# Patient Record
Sex: Female | Born: 1995 | Hispanic: No | Marital: Single | Smoking: Never smoker
Health system: Southern US, Community
[De-identification: ages and names within clinical notes are randomized; demographics above are authoritative.]

---

## 2017-08-19 ENCOUNTER — Encounter: Payer: Self-pay | Admitting: Emergency Medicine

## 2017-08-19 ENCOUNTER — Emergency Department
Admission: EM | Admit: 2017-08-19 | Discharge: 2017-08-19 | Disposition: A | Discharge status: Home / Self Care | Attending: Emergency Medicine | Admitting: Emergency Medicine

## 2017-08-19 ENCOUNTER — Other Ambulatory Visit: Payer: Self-pay

## 2017-08-19 ENCOUNTER — Emergency Department

## 2017-08-19 DIAGNOSIS — S99912A Unspecified injury of left ankle, initial encounter: Secondary | ICD-10-CM | POA: Diagnosis present

## 2017-08-19 DIAGNOSIS — W010XXA Fall on same level from slipping, tripping and stumbling without subsequent striking against object, initial encounter: Secondary | ICD-10-CM | POA: Diagnosis not present

## 2017-08-19 DIAGNOSIS — Y998 Other external cause status: Secondary | ICD-10-CM | POA: Insufficient documentation

## 2017-08-19 DIAGNOSIS — Y9389 Activity, other specified: Secondary | ICD-10-CM | POA: Diagnosis not present

## 2017-08-19 DIAGNOSIS — Y929 Unspecified place or not applicable: Secondary | ICD-10-CM | POA: Insufficient documentation

## 2017-08-19 DIAGNOSIS — S93492A Sprain of other ligament of left ankle, initial encounter: Secondary | ICD-10-CM | POA: Insufficient documentation

## 2017-08-19 DIAGNOSIS — Z79899 Other long term (current) drug therapy: Secondary | ICD-10-CM | POA: Insufficient documentation

## 2017-08-19 DIAGNOSIS — Z91040 Latex allergy status: Secondary | ICD-10-CM | POA: Insufficient documentation

## 2017-08-19 MED ORDER — IBUPROFEN 800 MG PO TABS
800.0000 mg | ORAL_TABLET | Freq: Once | ORAL | Status: AC
  Administered 2017-08-19: 800 mg via ORAL
  Filled 2017-08-19: qty 1

## 2017-08-19 MED ORDER — IBUPROFEN 800 MG PO TABS: 800.0000 mg | ORAL_TABLET | Freq: Three times a day (TID) | ORAL | 0 refills | Status: AC | PRN

## 2017-08-19 NOTE — Discharge Instructions (Addendum)
Please rest ice and elevate the left ankle.  Use crutches as needed for ambulation.  He may begin weightbearing as tolerated without crutches once you are able to ambulate without a limp.  You may take ibuprofen 800 mg every 8 hours as needed with food for the next 7-10 days.  I would recommend purchasing lace up ankle brace, ASO brace to wear for the next 6 weeks when you are performing any significant physical activity such as running cutting or jumping to protect ankle ligaments and prevent reinjury.  You may follow-up with orthopedics in 1 week if no improvement and continued ankle pain.

## 2017-08-19 NOTE — ED Triage Notes (Signed)
States she missed a step twisted left ankle about 1 hr PTA  Positive swelling  Good pulses

## 2017-08-19 NOTE — ED Provider Notes (Signed)
[facility] EMERGENCY DEPARTMENT Provider Note   CSN: [id number] Arrival date & time: 08/19/17  1643     History   Chief Complaint Chief Complaint  Patient presents with  . Ankle Pain    HPI Alisha Abbott is a 22 y.o. female presents to the emergency department for evaluation of left ankle pain.  Patient states just earlier today she was walking suffered a mechanical fall and sprained her left ankle.  Patient suffered an inversion ankle injury.  She complains of lateral ankle pain and swelling.  She did feel a pop.  She is walking but with moderate discomfort.  She denies any medication for pain.  She denies any medial ankle pain.  No proximal tib-fib discomfort.  She denies any knee pain.  No history of ankle injuries.  HPI  History reviewed. No pertinent past medical history.  There are no active problems to display for this patient.   History reviewed. No pertinent surgical history.   OB History   None      Home Medications    Prior to Admission medications   Medication Sig Start Date End Date Taking? Authorizing Provider  escitalopram (LEXAPRO) 10 MG tablet Take 10 mg by mouth daily.   Yes [provider]  ibuprofen (ADVIL,MOTRIN) 800 MG tablet Take 1 tablet (800 mg total) by mouth every 8 (eight) hours as needed. 08/19/17   Evon Slack, PA-C    Family History No family history on file.  Social History Social History   Tobacco Use  . Smoking status: Never Smoker  . Smokeless tobacco: Never Used  Substance Use Topics  . Alcohol use: Yes    Comment: occasional  . Drug use: Not on file     Allergies   Latex   Review of Systems Review of Systems  Constitutional: Negative for fever.  Respiratory: Negative for shortness of breath.   Cardiovascular: Negative for chest pain.  Gastrointestinal: Negative for abdominal pain.  Musculoskeletal: Positive for arthralgias, gait problem and joint swelling. Negative for back pain  and myalgias.  Skin: Negative for rash.  Neurological: Negative for dizziness and headaches.     Physical Exam Updated Vital Signs BP 115/73 (BP Location: Right Arm)   Pulse 79   Temp 98.4 F (36.9 C)   Resp 20   Ht 5\' 9"  (1.753 m)   Wt 71.2 kg (157 lb)   LMP 08/08/2017 (Approximate)   SpO2 98%   BMI 23.18 kg/m   Physical Exam  Constitutional: She is oriented to person, place, and time. She appears well-developed and well-nourished.  HENT:  Head: Normocephalic and atraumatic.  Eyes: Conjunctivae are normal.  Neck: Normal range of motion.  Cardiovascular: Normal rate.  Pulmonary/Chest: Effort normal. No respiratory distress.  Musculoskeletal:  Examination of the left ankle and lower extremity shows patient has no tenderness to palpation throughout the knee or proximal tib-fib region.  Patient is tender along the lateral malleolus but nontender along the medial malleolus.  Ankle plantar flexion dorsiflexion is intact.  She is nontender throughout the calcaneus talus and tarsals or metatarsals or phalanges.  Negative syndesmotic squeeze test.  Neurological: She is alert and oriented to person, place, and time.  Skin: Skin is warm. No rash noted.  Psychiatric: She has a normal mood and affect. Her behavior is normal. Thought content normal.     ED Treatments / Results  Labs (all labs ordered are listed, but only abnormal results are displayed) Labs Reviewed - No data  to display  EKG None  Radiology Dg Ankle Complete Left  Result Date: 08/19/2017 CLINICAL DATA:  22 y/o  F; twisted ankle with swelling. EXAM: LEFT ANKLE COMPLETE - 3+ VIEW COMPARISON:  None. FINDINGS: There is no evidence of fracture, dislocation, or joint effusion. There is no evidence of arthropathy or other focal bone abnormality. Mild soft tissue of the ankle joint. IMPRESSION: No acute fracture or dislocation identified. Electronically Signed   By: Mitzi HansenLance  Furusawa-Stratton M.D.   On: 08/19/2017 17:35     Procedures .Splint Application Date/Time: 08/19/2017 6:08 PM Performed by: Evon SlackGaines, Naheem Mosco C, PA-C Authorized by: Evon SlackGaines, Joseh Sjogren C, PA-C   Consent:    Consent obtained:  Verbal   Consent given by:  Patient   Risks discussed:  Pain and swelling   Alternatives discussed:  No treatment Pre-procedure details:    Sensation:  Normal Procedure details:    Laterality:  Left   Location:  Ankle   Splint type:  Ankle stirrup   Supplies:  Prefabricated splint Post-procedure details:    Pain:  Improved   Sensation:  Normal   Patient tolerance of procedure:  Tolerated well, no immediate complications   (including critical care time)  Medications Ordered in ED Medications  ibuprofen (ADVIL,MOTRIN) tablet 800 mg (800 mg Oral Given 08/19/17 1732)     Initial Impression / Assessment and Plan / ED Course  I have reviewed the triage vital signs and the nursing notes.  Pertinent labs & imaging results that were available during my care of the patient were reviewed by me and considered in my medical decision making (see chart for details).     22 year old female with left lateral ankle sprain.  She is placed into a ankle stirrup splint.  She will start ibuprofen.  She is given crutches to help with ambulation.  She will rest ice and elevate.  She is educated on self-care for ankle sprain.  She will follow with orthopedics if no improvement 1 week.  Final Clinical Impressions(s) / ED Diagnoses   Final diagnoses:  Sprain of anterior talofibular ligament of left ankle, initial encounter    ED Discharge Orders        Ordered    ibuprofen (ADVIL,MOTRIN) 800 MG tablet  Every 8 hours PRN     08/19/17 1800       Ronnette JuniperGaines, Beyla Loney C, PA-C 08/19/17 1809    Myrna BlazerSchaevitz, David Matthew, MD 08/19/17 2042

## 2018-09-09 IMAGING — DX DG ANKLE COMPLETE 3+V*L*
3 series · 3 of 3 positions shown · non-contrast
Comparison: None.

CLINICAL DATA: 21 y/o  F; twisted ankle with swelling.

EXAM:
LEFT ANKLE COMPLETE - 3+ VIEW

[ankle ap]
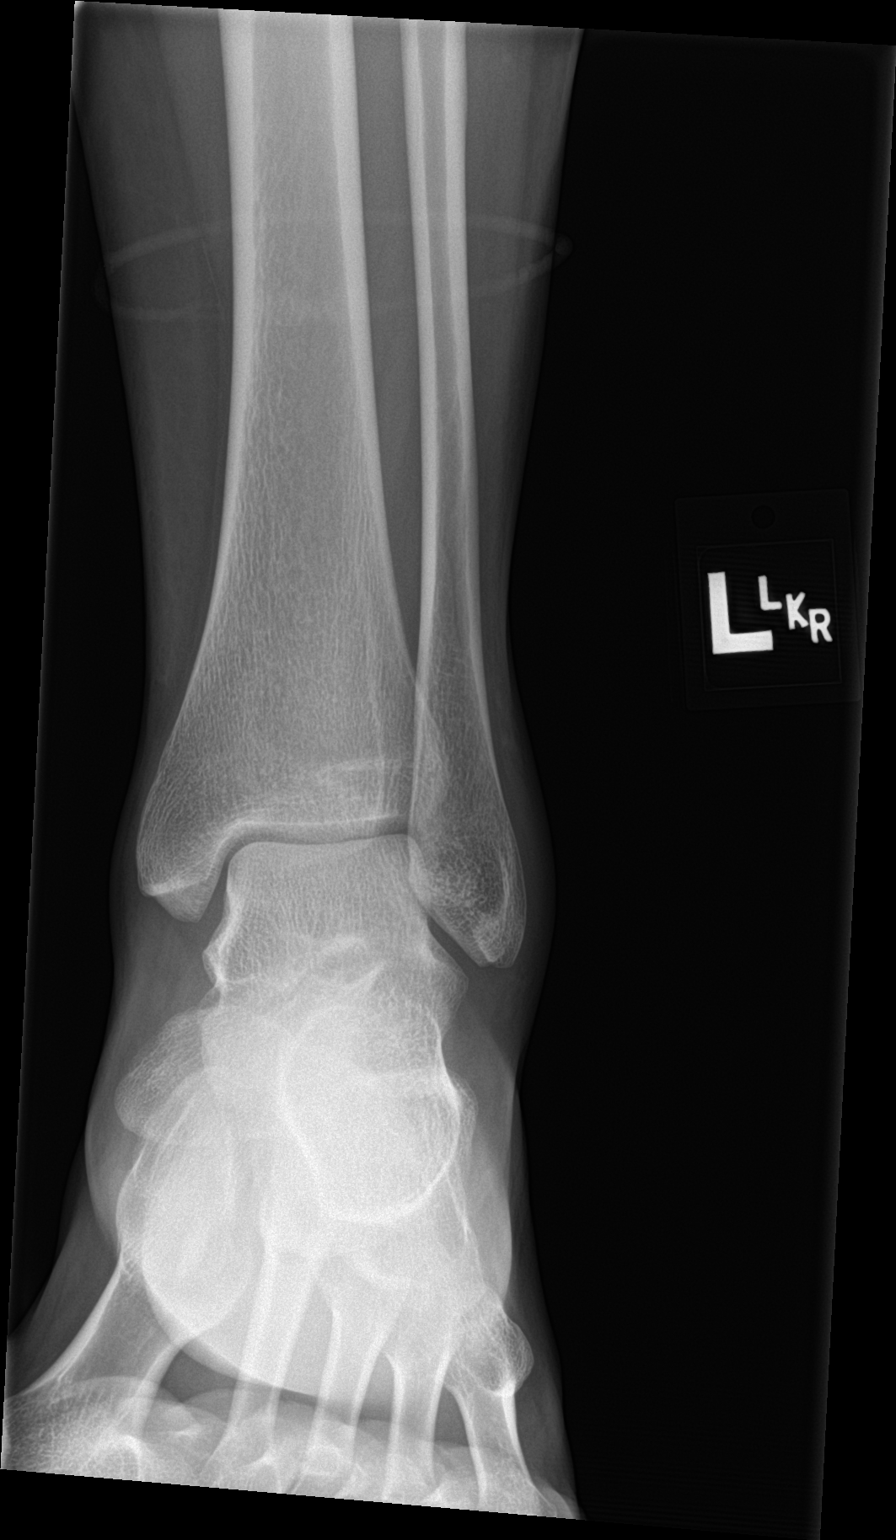

[ankle obl]
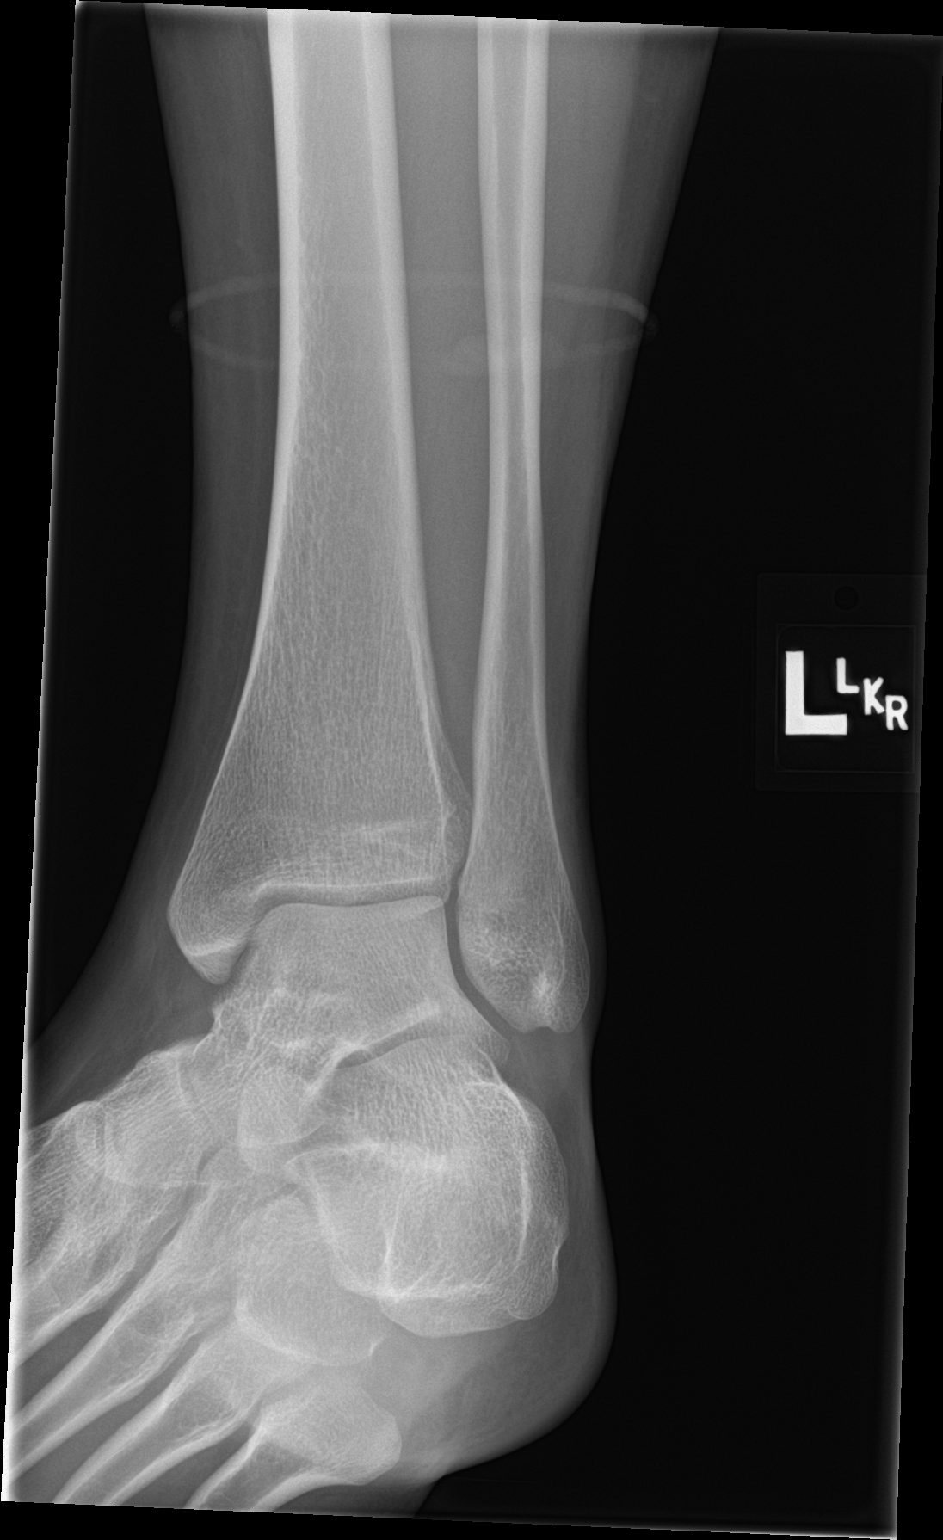

[ankle lat]
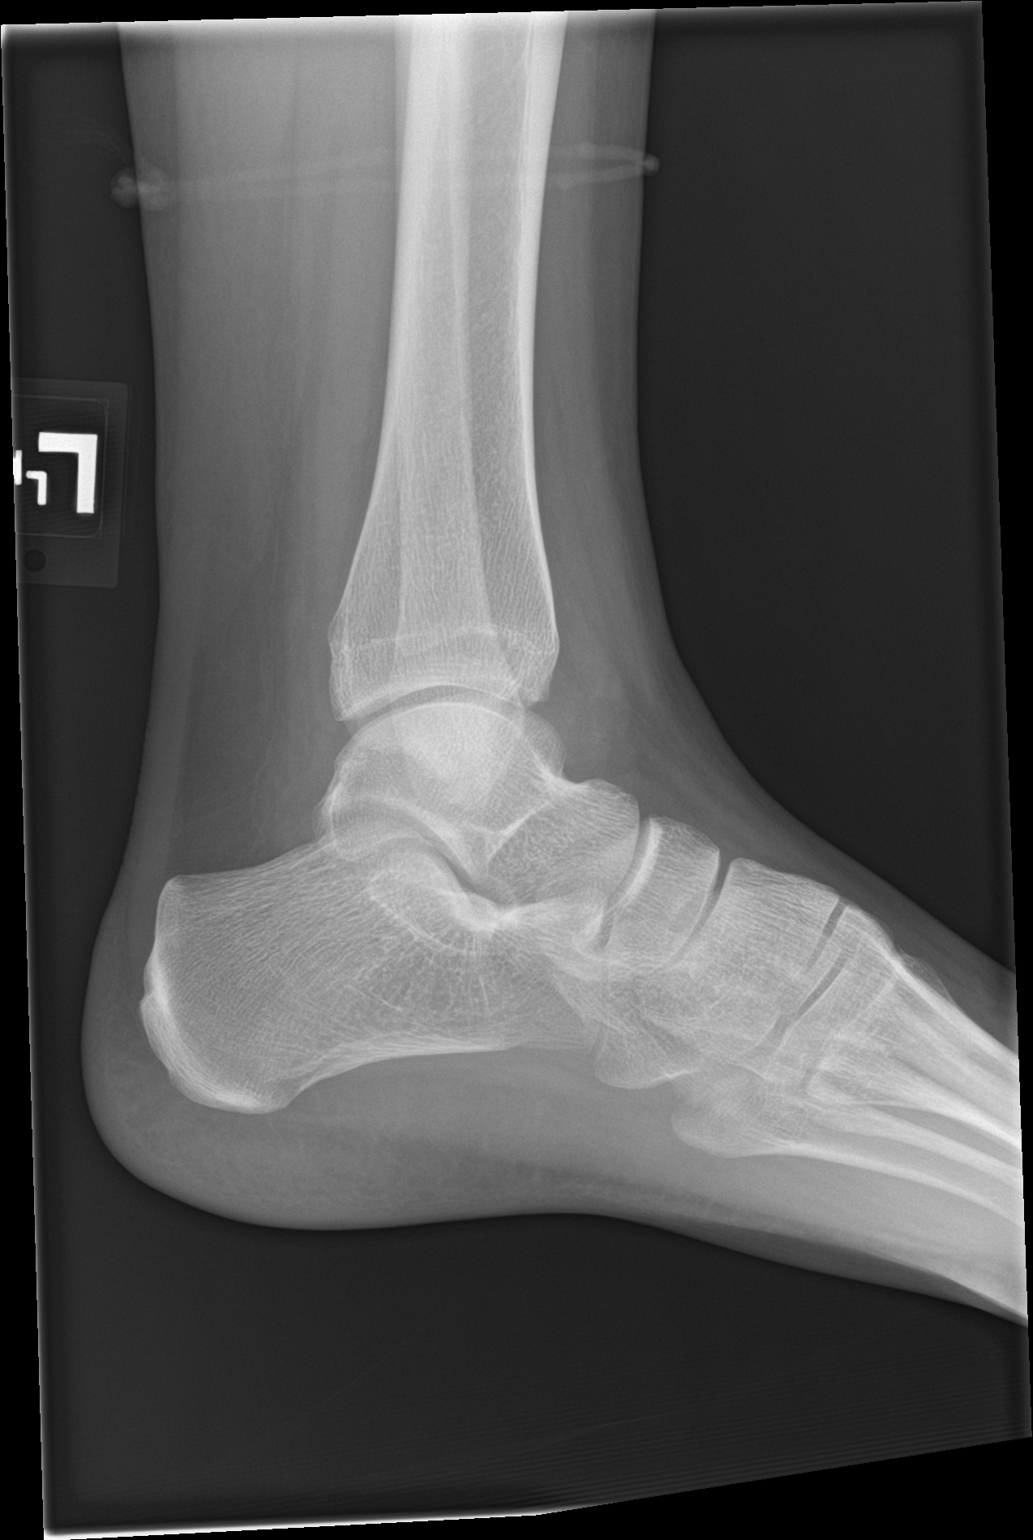

[3 of 3 positions shown; findings below may reference images not displayed]

FINDINGS: There is no evidence of fracture, dislocation, or joint effusion.
There is no evidence of arthropathy or other focal bone abnormality.
Mild soft tissue of the ankle joint.
IMPRESSION: No acute fracture or dislocation identified.

By: Tamica Castle M.D.

## 2019-03-23 ENCOUNTER — Other Ambulatory Visit: Payer: Self-pay

## 2019-03-23 DIAGNOSIS — Z20822 Contact with and (suspected) exposure to covid-19: Secondary | ICD-10-CM

## 2019-03-24 LAB — NOVEL CORONAVIRUS, NAA: SARS-CoV-2, NAA: NOT DETECTED

## 2019-03-25 ENCOUNTER — Telehealth: Payer: Self-pay | Admitting: General Practice

## 2019-03-25 NOTE — Telephone Encounter (Signed)
Gave patient negative covid test results Patient understood
